# Patient Record
Sex: Male | Born: 1935 | Race: White | Hispanic: No | Marital: Married | Smoking: Former smoker
Health system: Southern US, Community
[De-identification: ages and names within clinical notes are randomized; demographics above are authoritative.]

## PROBLEM LIST (undated history)

## (undated) DIAGNOSIS — E119 Type 2 diabetes mellitus without complications: Secondary | ICD-10-CM

## (undated) DIAGNOSIS — I10 Essential (primary) hypertension: Secondary | ICD-10-CM

## (undated) DIAGNOSIS — E785 Hyperlipidemia, unspecified: Secondary | ICD-10-CM

## (undated) DIAGNOSIS — J189 Pneumonia, unspecified organism: Secondary | ICD-10-CM

## (undated) DIAGNOSIS — J45909 Unspecified asthma, uncomplicated: Secondary | ICD-10-CM

## (undated) DIAGNOSIS — J449 Chronic obstructive pulmonary disease, unspecified: Secondary | ICD-10-CM

## (undated) DIAGNOSIS — J42 Unspecified chronic bronchitis: Secondary | ICD-10-CM

## (undated) HISTORY — DX: Chronic obstructive pulmonary disease, unspecified: J44.9

## (undated) HISTORY — DX: Unspecified chronic bronchitis: J42

## (undated) HISTORY — DX: Type 2 diabetes mellitus without complications: E11.9

## (undated) HISTORY — DX: Essential (primary) hypertension: I10

## (undated) HISTORY — DX: Hyperlipidemia, unspecified: E78.5

## (undated) HISTORY — DX: Pneumonia, unspecified organism: J18.9

## (undated) HISTORY — DX: Unspecified asthma, uncomplicated: J45.909

---

## 1990-09-10 HISTORY — PX: OTHER SURGICAL HISTORY: SHX169

## 2012-04-07 ENCOUNTER — Other Ambulatory Visit (HOSPITAL_BASED_OUTPATIENT_CLINIC_OR_DEPARTMENT_OTHER): Payer: Self-pay | Admitting: Family Medicine

## 2012-04-07 ENCOUNTER — Ambulatory Visit (HOSPITAL_BASED_OUTPATIENT_CLINIC_OR_DEPARTMENT_OTHER)
Admission: RE | Admit: 2012-04-07 | Discharge: 2012-04-07 | Disposition: A | Discharge status: Home / Self Care | Payer: Medicare Other | Source: Ambulatory Visit | Attending: Family Medicine | Admitting: Family Medicine

## 2012-04-07 DIAGNOSIS — J449 Chronic obstructive pulmonary disease, unspecified: Secondary | ICD-10-CM | POA: Insufficient documentation

## 2012-04-07 DIAGNOSIS — J4489 Other specified chronic obstructive pulmonary disease: Secondary | ICD-10-CM | POA: Insufficient documentation

## 2012-04-07 DIAGNOSIS — Z87891 Personal history of nicotine dependence: Secondary | ICD-10-CM

## 2012-04-07 DIAGNOSIS — R0602 Shortness of breath: Secondary | ICD-10-CM | POA: Insufficient documentation

## 2013-01-22 ENCOUNTER — Telehealth: Payer: Self-pay | Admitting: *Deleted

## 2013-01-22 NOTE — Telephone Encounter (Signed)
He is due to come back for a lab appointment and a week later he needs to see Dr Alberteen Sam to get all his meds refilled (sometime in the beginning of June).  Please contact him and set him up.  Also find out what pharmacy he wants is pravastatin, let me know and I'll refill it for 30 days only.  Thanks PG

## 2013-01-22 NOTE — Telephone Encounter (Signed)
Needs 3 month refill on Provastin

## 2013-02-16 ENCOUNTER — Other Ambulatory Visit: Payer: Self-pay | Admitting: *Deleted

## 2013-02-16 ENCOUNTER — Other Ambulatory Visit: Payer: Medicare Other | Admitting: Family Medicine

## 2013-02-16 DIAGNOSIS — R7301 Impaired fasting glucose: Secondary | ICD-10-CM

## 2013-02-16 LAB — COMPLETE METABOLIC PANEL WITH GFR
ALT: 26 U/L (ref 0–53)
AST: 27 U/L (ref 0–37)
Albumin: 3.8 g/dL (ref 3.5–5.2)
Alkaline Phosphatase: 59 U/L (ref 39–117)
BUN: 21 mg/dL (ref 6–23)
CO2: 26 mEq/L (ref 19–32)
Calcium: 9.2 mg/dL (ref 8.4–10.5)
Chloride: 105 mEq/L (ref 96–112)
Creat: 1.21 mg/dL (ref 0.50–1.35)
GFR, Est African American: 65 mL/min
GFR, Est Non African American: 57 mL/min — ABNORMAL LOW
Glucose, Bld: 155 mg/dL — ABNORMAL HIGH (ref 70–99)
Potassium: 4.8 mEq/L (ref 3.5–5.3)
Sodium: 143 mEq/L (ref 135–145)
Total Bilirubin: 0.4 mg/dL (ref 0.3–1.2)
Total Protein: 6.7 g/dL (ref 6.0–8.3)

## 2013-02-16 LAB — HEMOGLOBIN A1C
Hgb A1c MFr Bld: 7 % — ABNORMAL HIGH (ref <5.7)
Mean Plasma Glucose: 154 mg/dL — ABNORMAL HIGH (ref <117)

## 2013-02-24 ENCOUNTER — Encounter: Payer: Self-pay | Admitting: Family Medicine

## 2013-02-24 ENCOUNTER — Ambulatory Visit (INDEPENDENT_AMBULATORY_CARE_PROVIDER_SITE_OTHER): Payer: Medicare Other | Admitting: Family Medicine

## 2013-02-24 VITALS — BP 139/72 | HR 83 | Wt 256.0 lb

## 2013-02-24 DIAGNOSIS — IMO0001 Reserved for inherently not codable concepts without codable children: Secondary | ICD-10-CM

## 2013-02-24 DIAGNOSIS — I10 Essential (primary) hypertension: Secondary | ICD-10-CM

## 2013-02-24 DIAGNOSIS — J4489 Other specified chronic obstructive pulmonary disease: Secondary | ICD-10-CM

## 2013-02-24 DIAGNOSIS — J449 Chronic obstructive pulmonary disease, unspecified: Secondary | ICD-10-CM

## 2013-02-24 DIAGNOSIS — E785 Hyperlipidemia, unspecified: Secondary | ICD-10-CM

## 2013-02-24 MED ORDER — LOSARTAN POTASSIUM-HCTZ 100-12.5 MG PO TABS: ORAL_TABLET | ORAL | Status: DC

## 2013-02-24 MED ORDER — PRAVASTATIN SODIUM 40 MG PO TABS: 40.0000 mg | ORAL_TABLET | Freq: Every day | ORAL | Status: DC

## 2013-02-24 MED ORDER — SAXAGLIPTIN-METFORMIN ER 2.5-1000 MG PO TB24: 1.0000 | ORAL_TABLET | Freq: Two times a day (BID) | ORAL | Status: DC

## 2013-02-24 NOTE — Patient Instructions (Addendum)
1)  Blood Sugar - Finish up the Kombigylze and Metformin you have then switch over to the Kombiglyze 2.01/999 twice a day.  Your A1c was up a little from 6.5 to 7 so you need to work a little harder on your diet and exercise.    2)  Cholesterol - Continue on the pravastatin.    3)  Blood Pressure - Stay on the losartan HCT.    4)  COPD - Continue on Symbicort.  Use 2 puffs twice a day on days that you are going to be very active.    Diabetes and Exercise Regular exercise is important and can help:   Control blood glucose (sugar).  Decrease blood pressure.    Control blood lipids (cholesterol, triglycerides).  Improve overall health. BENEFITS FROM EXERCISE  Improved fitness.  Improved flexibility.  Improved endurance.  Increased bone density.  Weight control.  Increased muscle strength.  Decreased body fat.  Improvement of the body's use of insulin, a hormone.  Increased insulin sensitivity.  Reduction of insulin needs.  Reduced stress and tension.  Helps you feel better. People with diabetes who add exercise to their lifestyle gain additional benefits, including:  Weight loss.  Reduced appetite.  Improvement of the body's use of blood glucose.  Decreased risk factors for heart disease:  Lowering of cholesterol and triglycerides.  Raising the level of good cholesterol (high-density lipoproteins, HDL).  Lowering blood sugar.  Decreased blood pressure. TYPE 1 DIABETES AND EXERCISE  Exercise will usually lower your blood glucose.  If blood glucose is greater than 240 mg/dl, check urine ketones. If ketones are present, do not exercise.  Location of the insulin injection sites may need to be adjusted with exercise. Avoid injecting insulin into areas of the body that will be exercised. For example, avoid injecting insulin into:  The arms when playing tennis.  The legs when jogging. For more information, discuss this with your caregiver.  Keep a  record of:  Food intake.  Type and amount of exercise.  Expected peak times of insulin action.  Blood glucose levels. Do this before, during, and after exercise. Review your records with your caregiver. This will help you to develop guidelines for adjusting food intake and insulin amounts.  TYPE 2 DIABETES AND EXERCISE  Regular physical activity can help control blood glucose.  Exercise is important because it may:  Increase the body's sensitivity to insulin.  Improve blood glucose control.  Exercise reduces the risk of heart disease. It decreases serum cholesterol and triglycerides. It also lowers blood pressure.  Those who take insulin or oral hypoglycemic agents should watch for signs of hypoglycemia. These signs include dizziness, shaking, sweating, chills, and confusion.  Body water is lost during exercise. It must be replaced. This will help to avoid loss of body fluids (dehydration) or heat stroke. Be sure to talk to your caregiver before starting an exercise program to make sure it is safe for you. Remember, any activity is better than none.  Document Released: 11/17/2003 Document Revised: 11/19/2011 Document Reviewed: 03/03/2009 [facility] Patient Information 2014 [place], [place].

## 2013-02-24 NOTE — Progress Notes (Signed)
  Subjective:    Patient ID: Joseph Payne, male    DOB: [birth year], 79 y.o.   MRN: [id number]  HPI  [name] is here today to go over his labs, discuss the issues below and get medication refills:  1)  Type II DM - He is taking Kombiglyze 5/500 and 2 metformin ER 500 mg daily.  He does not currently check his blood sugar.  He admits to not following a diabetic diet all of the time (ice cream/pie).  He has cut back on his wine considerably.    2)  BP - His BP is controlled on 1/2 of the losartan HCT twice a day.  3)  Cholesterol - He is doing fine on 40 mg of pravachol at night.   4)  COPD - He feels that his breathing is okay with 1 puff of Symbicort twice a day.  He is no longer doing Spiriva or Combivent.     Review of Systems  Constitutional: Negative for activity change, appetite change, fatigue and unexpected weight change.  HENT: Negative.   Eyes: Negative.   Respiratory: Negative for cough, chest tightness and shortness of breath.   Cardiovascular: Negative for chest pain, palpitations and leg swelling.  Gastrointestinal: Negative for abdominal pain, diarrhea, constipation and blood in stool.  Endocrine: Negative for polydipsia, polyphagia and polyuria.  Genitourinary: Negative for difficulty urinating.  Musculoskeletal: Negative for myalgias, joint swelling and arthralgias.  Skin: Negative for rash.  Neurological: Negative for dizziness, numbness and headaches.  Hematological: Negative.   Psychiatric/Behavioral: Negative.        Objective:   Physical Exam  Constitutional: He appears well-nourished.  HENT:  Head: Normocephalic.  Nose: Nose normal.  Mouth/Throat: Oropharynx is clear and moist.  Eyes: Conjunctivae are normal. No scleral icterus.  Neck: Neck supple. No thyromegaly present.  Cardiovascular: Normal rate, regular rhythm and normal heart sounds.   Pulmonary/Chest: Effort normal and breath sounds normal.  Abdominal: Soft. He exhibits no mass. There is no  tenderness.  Musculoskeletal: Normal range of motion.  Lymphadenopathy:    He has no cervical adenopathy.  Neurological: He is alert.  Skin: Skin is warm and dry. No rash noted.  Psychiatric: He has a normal mood and affect. His behavior is normal. Judgment and thought content normal.          Assessment & Plan:

## 2013-03-22 ENCOUNTER — Encounter: Payer: Self-pay | Admitting: Family Medicine

## 2013-03-22 DIAGNOSIS — J449 Chronic obstructive pulmonary disease, unspecified: Secondary | ICD-10-CM | POA: Insufficient documentation

## 2013-03-22 DIAGNOSIS — IMO0001 Reserved for inherently not codable concepts without codable children: Secondary | ICD-10-CM | POA: Insufficient documentation

## 2013-03-22 DIAGNOSIS — I10 Essential (primary) hypertension: Secondary | ICD-10-CM | POA: Insufficient documentation

## 2013-03-22 DIAGNOSIS — J4489 Other specified chronic obstructive pulmonary disease: Secondary | ICD-10-CM | POA: Insufficient documentation

## 2013-03-22 DIAGNOSIS — E785 Hyperlipidemia, unspecified: Secondary | ICD-10-CM | POA: Insufficient documentation

## 2013-03-22 NOTE — Assessment & Plan Note (Addendum)
He was given a prescription for Kombiglyze 10/998 to take BID.  He is to work harder on his diet and exercise.

## 2013-03-22 NOTE — Assessment & Plan Note (Signed)
He was reminded that he should be using the Symbicort BID.

## 2013-03-22 NOTE — Assessment & Plan Note (Signed)
Refilled his pravastatin  

## 2013-03-22 NOTE — Assessment & Plan Note (Signed)
Refilled his losartan HCT.   

## 2013-07-21 ENCOUNTER — Other Ambulatory Visit: Payer: Self-pay | Admitting: *Deleted

## 2013-07-21 DIAGNOSIS — E119 Type 2 diabetes mellitus without complications: Secondary | ICD-10-CM

## 2013-07-21 DIAGNOSIS — E785 Hyperlipidemia, unspecified: Secondary | ICD-10-CM

## 2013-07-21 DIAGNOSIS — R5381 Other malaise: Secondary | ICD-10-CM

## 2013-07-22 ENCOUNTER — Other Ambulatory Visit: Payer: Medicare Other

## 2013-07-22 LAB — CBC WITH DIFFERENTIAL/PLATELET
Basophils Absolute: 0.1 10*3/uL (ref 0.0–0.1)
Basophils Relative: 1 % (ref 0–1)
Eosinophils Absolute: 0.3 10*3/uL (ref 0.0–0.7)
Eosinophils Relative: 4 % (ref 0–5)
HCT: 40.1 % (ref 39.0–52.0)
Hemoglobin: 13.3 g/dL (ref 13.0–17.0)
Lymphocytes Relative: 23 % (ref 12–46)
Lymphs Abs: 2.1 10*3/uL (ref 0.7–4.0)
MCH: 31.7 pg (ref 26.0–34.0)
MCHC: 33.2 g/dL (ref 30.0–36.0)
MCV: 95.7 fL (ref 78.0–100.0)
Monocytes Absolute: 0.8 10*3/uL (ref 0.1–1.0)
Monocytes Relative: 8 % (ref 3–12)
Neutro Abs: 5.8 10*3/uL (ref 1.7–7.7)
Neutrophils Relative %: 64 % (ref 43–77)
Platelets: 207 10*3/uL (ref 150–400)
RBC: 4.19 MIL/uL — ABNORMAL LOW (ref 4.22–5.81)
RDW: 14.6 % (ref 11.5–15.5)
WBC: 9 10*3/uL (ref 4.0–10.5)

## 2013-07-22 LAB — COMPLETE METABOLIC PANEL WITH GFR
ALT: 22 U/L (ref 0–53)
AST: 23 U/L (ref 0–37)
Albumin: 3.7 g/dL (ref 3.5–5.2)
Alkaline Phosphatase: 71 U/L (ref 39–117)
BUN: 15 mg/dL (ref 6–23)
CO2: 26 mEq/L (ref 19–32)
Calcium: 8.8 mg/dL (ref 8.4–10.5)
Chloride: 103 mEq/L (ref 96–112)
Creat: 1.03 mg/dL (ref 0.50–1.35)
GFR, Est African American: 79 mL/min
GFR, Est Non African American: 68 mL/min
Glucose, Bld: 186 mg/dL — ABNORMAL HIGH (ref 70–99)
Potassium: 4.3 mEq/L (ref 3.5–5.3)
Sodium: 141 mEq/L (ref 135–145)
Total Bilirubin: 0.5 mg/dL (ref 0.3–1.2)
Total Protein: 6.7 g/dL (ref 6.0–8.3)

## 2013-07-22 LAB — LIPID PANEL
Cholesterol: 149 mg/dL (ref 0–200)
HDL: 37 mg/dL — ABNORMAL LOW (ref >39)
LDL Cholesterol: 80 mg/dL (ref 0–99)
Total CHOL/HDL Ratio: 4 Ratio
Triglycerides: 158 mg/dL — ABNORMAL HIGH (ref <150)
VLDL: 32 mg/dL (ref 0–40)

## 2013-07-22 LAB — HEMOGLOBIN A1C
Hgb A1c MFr Bld: 7.8 % — ABNORMAL HIGH (ref <5.7)
Mean Plasma Glucose: 177 mg/dL — ABNORMAL HIGH (ref <117)

## 2013-07-30 ENCOUNTER — Encounter: Payer: Self-pay | Admitting: Family Medicine

## 2013-07-30 ENCOUNTER — Ambulatory Visit (INDEPENDENT_AMBULATORY_CARE_PROVIDER_SITE_OTHER): Payer: Medicare Other | Admitting: Family Medicine

## 2013-07-30 VITALS — BP 147/63 | HR 87 | Resp 16 | Ht 74.3 in | Wt 249.0 lb

## 2013-07-30 DIAGNOSIS — IMO0001 Reserved for inherently not codable concepts without codable children: Secondary | ICD-10-CM

## 2013-07-30 DIAGNOSIS — J4489 Other specified chronic obstructive pulmonary disease: Secondary | ICD-10-CM

## 2013-07-30 DIAGNOSIS — I10 Essential (primary) hypertension: Secondary | ICD-10-CM

## 2013-07-30 DIAGNOSIS — J449 Chronic obstructive pulmonary disease, unspecified: Secondary | ICD-10-CM

## 2013-07-30 DIAGNOSIS — E785 Hyperlipidemia, unspecified: Secondary | ICD-10-CM

## 2013-07-30 MED ORDER — METFORMIN HCL 1000 MG PO TABS: 1000.0000 mg | ORAL_TABLET | Freq: Two times a day (BID) | ORAL | Status: DC

## 2013-07-30 MED ORDER — GLIPIZIDE ER 5 MG PO TB24: 5.0000 mg | ORAL_TABLET | Freq: Every day | ORAL | Status: DC

## 2013-07-30 MED ORDER — BUDESONIDE-FORMOTEROL FUMARATE 160-4.5 MCG/ACT IN AERO: 2.0000 | INHALATION_SPRAY | Freq: Two times a day (BID) | RESPIRATORY_TRACT | Status: DC

## 2013-07-30 MED ORDER — PIOGLITAZONE HCL 15 MG PO TABS: 15.0000 mg | ORAL_TABLET | Freq: Every day | ORAL | Status: DC

## 2013-07-30 MED ORDER — LOSARTAN POTASSIUM-HCTZ 100-12.5 MG PO TABS: ORAL_TABLET | ORAL | Status: DC

## 2013-07-30 MED ORDER — PRAVASTATIN SODIUM 40 MG PO TABS: 40.0000 mg | ORAL_TABLET | Freq: Every day | ORAL | Status: DC

## 2013-07-30 NOTE — Progress Notes (Signed)
  Subjective:    Patient ID: Joseph Payne, male    DOB: 05/03/1931, 79 y.o.   MRN: [id number]  HPI  Joseph Payne is here today to go over his most recent lab results and to discuss the conditions listed below:    1)  Hypertension:  He is currently taking losartan/HCT 100-12.5 mg (1/2 pill).  He also checks his blood pressure at home and it has been 140/80.    2)  Type II DM:  He is taking Kombiglyze XR (2.01-999 mg) every day.  He said his diet is "fair".  He has reduced his wine and ice cream every day.    3)  COPD:  He is doing well with Symbicort (160-4.5 mcg).  His only concern about this medication is the cost.  He has brought his new medication formulary.     Review of Systems  Constitutional: Negative.   HENT: Negative.   Eyes: Negative.   Respiratory: Positive for cough.   Cardiovascular: Negative.   Gastrointestinal: Negative.   Endocrine: Negative.   Genitourinary: Negative.   Musculoskeletal: Negative.   Skin: Negative.   Allergic/Immunologic: Negative.   Neurological: Negative.   Hematological: Negative.   Psychiatric/Behavioral: Negative.      Past Medical History  Diagnosis Date  . Hypertension   . Pneumonia   . Hyperlipidemia   . Diabetes mellitus without complication   . Other chronic bronchitis   . COPD (chronic obstructive pulmonary disease)     He smoked for about 10 years but worked in the [employer] for 45 years.      Family History  Problem Relation Age of Onset  . Asthma Mother   . Rheum arthritis Mother      History   Social History Narrative   Marital Status: Married Museum/gallery conservator)   Children: Daughter Lawson Fiscal)  and Son Tufo)    Pets:  None     Living Situation: Lives with  wife    Occupation: Retired Programmer, multimedia)    Education: Engineer, agricultural    Tobacco Use:  He smoked 1 ppd for about 10 years.  He quit 50 years ago at the age of 11.      Alcohol Use:  Occasional (Wine)    Drug Use:  None   Diet:  Regular   Exercise:  None    Hobbies: Gardening                    Objective:   Physical Exam  Vitals reviewed. Constitutional: He is oriented to person, place, and time. He appears well-developed and well-nourished. No distress.  HENT:  Head: Normocephalic.  Eyes: No scleral icterus.  Neck: Neck supple. No thyromegaly present.  Cardiovascular: Normal rate, regular rhythm and normal heart sounds.   Pulmonary/Chest: Effort normal and breath sounds normal.  Musculoskeletal: Normal range of motion. He exhibits no edema.  Neurological: He is alert and oriented to person, place, and time.  Skin: Skin is warm and dry. No rash noted.  Psychiatric: He has a normal mood and affect. His behavior is normal. Judgment and thought content normal.          Assessment & Plan:

## 2013-07-30 NOTE — Patient Instructions (Signed)
1)  Blood Sugar - Your A1c (average for 3 months) has increased so...limit your intake of sweets which includes juice.  Since the Kombiglyze is so expensive, let's try a new combination. Finish up the [employer] then switch over to a combination of pioglitazone 15 mg ([facility]) + glipizide XL 5 mg (HT)  + metformin 1000 mg (HT)  (2 x per day). We'll recheck your A1c at your CPE in 3 months.    2)  Cholesterol - Stay on the pravastatin.  I sent it to [facility] (Mail Order)     Diabetes and Standards of Medical Care  Diabetes is complicated. You may find that your diabetes team includes a dietitian, nurse, diabetes educator, eye doctor, and more. To help everyone know what is going on and to help you get the care you deserve, the following schedule of care was developed to help keep you on track. Below are the tests, exams, vaccines, medicines, education, and plans you will need. HbA1c test This test shows how well you have controlled your glucose over the past 2 3 months. It is used to see if your diabetes management plan needs to be adjusted.   It is performed at least 2 times a year if you are meeting treatment goals.  It is performed 4 times a year if therapy has changed or if you are not meeting treatment goals. Blood pressure test  This test is performed at every routine medical visit. The goal is less than 140/90 mmHg for most people, but 130/80 mmHg in some cases. Ask your health care provider about your goal. Dental exam  Follow up with the dentist regularly. Eye exam  If you are diagnosed with type 1 diabetes as a child, get an exam upon reaching the age of 10 years or older and have had diabetes for 3 5 years. Yearly eye exams are recommended after that initial eye exam.  If you are diagnosed with type 1 diabetes as an adult, get an exam within 5 years of diagnosis and then yearly.  If you are diagnosed with type 2 diabetes, get an exam as soon as possible after the diagnosis and then  yearly. Foot care exam  Visual foot exams are performed at every routine medical visit. The exams check for cuts, injuries, or other problems with the feet.  A comprehensive foot exam should be done yearly. This includes visual inspection as well as assessing foot pulses and testing for loss of sensation.  Check your feet nightly for cuts, injuries, or other problems with your feet. Tell your health care provider if anything is not healing. Kidney function test (urine microalbumin)  This test is performed once a year.  Type 1 diabetes: The first test is performed 5 years after diagnosis.  Type 2 diabetes: The first test is performed at the time of diagnosis.  A serum creatinine and estimated glomerular filtration rate (eGFR) test is done once a year to assess the level of chronic kidney disease (CKD), if present. Lipid profile (cholesterol, HDL, LDL, triglycerides)  Performed every 5 years for most people.  The goal for LDL is less than 100 mg/dL. If you are at high risk, the goal is less than 70 mg/dL.  The goal for HDL is 40 mg/dL 50 mg/dL for men and 50 mg/dL 60 mg/dL for women. An HDL cholesterol of 60 mg/dL or higher gives some protection against heart disease.  The goal for triglycerides is less than 150 mg/dL. Influenza vaccine, pneumococcal vaccine,  and hepatitis B vaccine  The influenza vaccine is recommended yearly.  The pneumococcal vaccine is generally given once in a lifetime. However, there are some instances when another vaccination is recommended. Check with your health care provider.  The hepatitis B vaccine is also recommended for adults with diabetes. Diabetes self-management education  Education is recommended at diagnosis and ongoing as needed. Treatment plan  Your treatment plan is reviewed at every medical visit. Document Released: 06/24/2009 Document Revised: 04/29/2013 Document Reviewed: 01/27/2013 [facility] Patient Information 2014 [place].

## 2013-07-30 NOTE — Assessment & Plan Note (Addendum)
His BP is higher than it should be. He is only taking his losartan-HCT 1/2 pill daily. He is supposed to be taking it twice a day.  If he can not remember to take his second dosage, he is to take it 1 x per day.  He will continue to get it at [facility].

## 2013-07-30 NOTE — Assessment & Plan Note (Signed)
His LDL is perfect on pravastatin.  His prescription was sent to [facility].

## 2013-07-30 NOTE — Assessment & Plan Note (Addendum)
His A1c has increased. Since the Kombiglyze is so expensive, we are going to try him on Actos 15 mg ([facility] RX), metformin 1000 mg BID and glipizide XL 5 mg daily.  He will get the latter 2 at [facility]  He is up to date on his eye exam.  We'll recheck his A1c in 3 months at his CPE.

## 2013-07-30 NOTE — Assessment & Plan Note (Signed)
Refilled his Symbicort.

## 2013-11-03 ENCOUNTER — Encounter: Payer: Medicare Other | Admitting: Family Medicine

## 2013-11-26 ENCOUNTER — Ambulatory Visit (INDEPENDENT_AMBULATORY_CARE_PROVIDER_SITE_OTHER): Payer: Medicare Other | Admitting: Family Medicine

## 2013-11-26 ENCOUNTER — Ambulatory Visit (HOSPITAL_BASED_OUTPATIENT_CLINIC_OR_DEPARTMENT_OTHER)
Admission: RE | Admit: 2013-11-26 | Discharge: 2013-11-26 | Disposition: A | Discharge status: Home / Self Care | Payer: Medicare Other | Source: Ambulatory Visit | Attending: Family Medicine | Admitting: Family Medicine

## 2013-11-26 ENCOUNTER — Encounter: Payer: Self-pay | Admitting: Family Medicine

## 2013-11-26 VITALS — BP 119/66 | HR 74 | Resp 16 | Wt 250.0 lb

## 2013-11-26 DIAGNOSIS — E785 Hyperlipidemia, unspecified: Secondary | ICD-10-CM

## 2013-11-26 DIAGNOSIS — E119 Type 2 diabetes mellitus without complications: Secondary | ICD-10-CM

## 2013-11-26 DIAGNOSIS — J449 Chronic obstructive pulmonary disease, unspecified: Secondary | ICD-10-CM

## 2013-11-26 DIAGNOSIS — R059 Cough, unspecified: Secondary | ICD-10-CM

## 2013-11-26 DIAGNOSIS — J4489 Other specified chronic obstructive pulmonary disease: Secondary | ICD-10-CM | POA: Insufficient documentation

## 2013-11-26 DIAGNOSIS — R05 Cough: Secondary | ICD-10-CM | POA: Insufficient documentation

## 2013-11-26 LAB — POCT GLYCOSYLATED HEMOGLOBIN (HGB A1C): Hemoglobin A1C: 6.4

## 2013-11-26 MED ORDER — BUDESONIDE-FORMOTEROL FUMARATE 160-4.5 MCG/ACT IN AERO: 2.0000 | INHALATION_SPRAY | Freq: Two times a day (BID) | RESPIRATORY_TRACT | Status: AC

## 2013-11-26 MED ORDER — PRAVASTATIN SODIUM 40 MG PO TABS: 40.0000 mg | ORAL_TABLET | Freq: Every day | ORAL | Status: DC

## 2013-11-26 MED ORDER — GLIPIZIDE ER 5 MG PO TB24
5.0000 mg | ORAL_TABLET | Freq: Every day | ORAL | Status: AC
Start: 2013-11-26 — End: 2014-11-26

## 2013-11-26 MED ORDER — PIOGLITAZONE HCL 15 MG PO TABS: 15.0000 mg | ORAL_TABLET | Freq: Every day | ORAL | Status: DC

## 2013-11-26 MED ORDER — METFORMIN HCL ER 500 MG PO TB24: 1000.0000 mg | ORAL_TABLET | Freq: Every day | ORAL | Status: DC

## 2013-11-26 NOTE — Progress Notes (Signed)
Subjective:    Patient ID: Joseph Payne, male    DOB: [birth year], 80 y.o.   MRN: [id number]  HPI  Joseph Payne is here today discuss the conditions listed below:   1)  Type II DM.  He has done well since his last office visit.  He was unable to tolerate more than 1000 mg of metformin. He also takes Actos (15 mg daily) and glipizide (5 mg daily). He needs his A1c rechecked.    2)  Cough - He would like to have a chest x-ray.  He has been having a mild productive cough for several months and wants to make sure he is not developing pneumonia.  He takes [employer].   3)  Hyperlipidemia - He is doing fine on pravastatin.    4)  COPD - His breathing has been better over the past couple of years on Symbicort.       Review of Systems  Constitutional: Negative for fatigue and unexpected weight change.  HENT: Negative.   Respiratory: Positive for cough (productive). Negative for shortness of breath.   Cardiovascular: Negative for chest pain, palpitations and leg swelling.  Gastrointestinal: Negative.   Genitourinary: Negative.   Musculoskeletal: Negative for myalgias.  Skin: Negative.   Neurological: Negative.   Psychiatric/Behavioral: Negative.      Past Medical History  Diagnosis Date  . Hypertension   . Pneumonia   . Hyperlipidemia   . Diabetes mellitus without complication   . Other chronic bronchitis   . COPD (chronic obstructive pulmonary disease)     He smoked for about 10 years but worked in the [employer] for 45 years.      Past Surgical History  Procedure Laterality Date  . Inguinal hernia repair  1992    Left     History   Social History Narrative   Marital Status: Married Museum/gallery conservator)   Children: Daughter Joseph Payne)  and Son Joseph Payne)    Pets:  None     Living Situation: Lives with  wife    Occupation: Retired Programmer, multimedia)    Education: Engineer, agricultural    Tobacco Use:  He smoked 1 ppd for about 10 years.  He quit 50 years ago at the age of 87.      Alcohol Use:  Occasional (Wine)    Drug Use:  None   Diet:  Regular   Exercise:  None   Hobbies: Gardening                  Family History  Problem Relation Age of Onset  . Asthma Mother   . Rheum arthritis Mother      Current Outpatient Prescriptions on File Prior to Visit  Medication Sig Dispense Refill  . losartan-hydrochlorothiazide (HYZAAR) 100-12.5 MG per tablet Take 1/2 tablet po twice a day  180 tablet  3  . metFORMIN (GLUCOPHAGE) 1000 MG tablet Take 1 tablet (1,000 mg total) by mouth 2 (two) times daily with a meal.  60 tablet  5   No current facility-administered medications on file prior to visit.     No Known Allergies      Objective:   Physical Exam  Constitutional: He appears well-nourished. No distress.  HENT:  Mouth/Throat: No oropharyngeal exudate.  Eyes: Conjunctivae are normal.  Neck: Neck supple.  Cardiovascular: Normal rate, regular rhythm and normal heart sounds.   Pulmonary/Chest: Effort normal and breath sounds normal. No respiratory distress. He has no wheezes. He has no rales.  Lymphadenopathy:  He has no cervical adenopathy.      Assessment & Plan:    Joseph FearingJames was seen today for medication management.  Diagnoses and associated orders for this visit:  Cough Comments: His CXR was normal.   - DG Chest 2 View; Future  Other and unspecified hyperlipidemia - pravastatin (PRAVACHOL) 40 MG tablet; Take 1 tablet (40 mg total) by mouth at bedtime.  COPD (chronic obstructive pulmonary disease) with chronic bronchitis - budesonide-formoterol (SYMBICORT) 160-4.5 MCG/ACT inhaler; Inhale 2 puffs into the lungs 2 (two) times daily.  Type II or unspecified type diabetes mellitus without mention of complication, not stated as uncontrolled - POCT glycosylated hemoglobin (Hb A1C) 6.4% - pioglitazone (ACTOS) 15 MG tablet; Take 1 tablet (15 mg total) by mouth daily. - glipiZIDE (GLUCOTROL XL) 5 MG 24 hr tablet; Take 1 tablet (5 mg total) by mouth  daily with breakfast. - metFORMIN (GLUCOPHAGE XR) 500 MG 24 hr tablet; Take 2 tablets (1,000 mg total) by mouth daily.

## 2014-01-20 ENCOUNTER — Other Ambulatory Visit: Payer: Self-pay | Admitting: Family Medicine

## 2014-01-31 DIAGNOSIS — E119 Type 2 diabetes mellitus without complications: Secondary | ICD-10-CM | POA: Insufficient documentation

## 2014-01-31 DIAGNOSIS — R05 Cough: Secondary | ICD-10-CM | POA: Insufficient documentation

## 2014-01-31 DIAGNOSIS — R059 Cough, unspecified: Secondary | ICD-10-CM | POA: Insufficient documentation

## 2014-02-01 ENCOUNTER — Other Ambulatory Visit: Payer: Self-pay | Admitting: Family Medicine

## 2014-02-09 ENCOUNTER — Encounter: Payer: Self-pay | Admitting: Family Medicine

## 2014-02-09 ENCOUNTER — Ambulatory Visit (INDEPENDENT_AMBULATORY_CARE_PROVIDER_SITE_OTHER): Payer: Medicare Other | Admitting: Family Medicine

## 2014-02-09 VITALS — BP 146/65 | HR 83 | Resp 16 | Ht 73.5 in | Wt 255.0 lb

## 2014-02-09 DIAGNOSIS — E119 Type 2 diabetes mellitus without complications: Secondary | ICD-10-CM

## 2014-02-09 DIAGNOSIS — Z0000 Encounter for general adult medical examination without abnormal findings: Secondary | ICD-10-CM

## 2014-02-09 DIAGNOSIS — Z1211 Encounter for screening for malignant neoplasm of colon: Secondary | ICD-10-CM

## 2014-02-09 DIAGNOSIS — Z23 Encounter for immunization: Secondary | ICD-10-CM

## 2014-02-09 DIAGNOSIS — I10 Essential (primary) hypertension: Secondary | ICD-10-CM

## 2014-02-09 LAB — POCT URINALYSIS DIPSTICK
Bilirubin, UA: NEGATIVE
Blood, UA: NEGATIVE
Glucose, UA: NEGATIVE
Ketones, UA: NEGATIVE
Leukocytes, UA: NEGATIVE
Nitrite, UA: NEGATIVE
Protein, UA: NEGATIVE
Spec Grav, UA: 1.02
Urobilinogen, UA: NEGATIVE
pH, UA: 5

## 2014-02-09 LAB — POCT UA - MICROALBUMIN
Albumin/Creatinine Ratio, Urine, POC: <2.7
Creatinine, POC: 183.9 mg/dL
Microalbumin Ur, POC: <5 mg/L

## 2014-02-09 MED ORDER — ZOSTER VACCINE LIVE 19400 UNT/0.65ML ~~LOC~~ SOLR: 0.6500 mL | Freq: Once | SUBCUTANEOUS | Status: AC

## 2014-02-09 MED ORDER — TETANUS-DIPHTH-ACELL PERTUSSIS 5-2.5-18.5 LF-MCG/0.5 IM SUSP: 0.5000 mL | Freq: Once | INTRAMUSCULAR | Status: AC

## 2014-02-09 NOTE — Progress Notes (Signed)
Subjective:    Patient ID: Joseph Payne, male    DOB: 06/04/1931, 80 y.o.   MRN: [id number]  HPI  Joseph Payne is here today for his annual CPE. He is doing well and has no medical complaints today.    Review of Systems  Constitutional: Negative for activity change, appetite change and unexpected weight change.  Cardiovascular: Negative for chest pain, palpitations and leg swelling.  Endocrine: Negative for polydipsia, polyphagia and polyuria.  Psychiatric/Behavioral: Negative for behavioral problems and sleep disturbance. The patient is not nervous/anxious.   All other systems reviewed and are negative.    Past Medical History  Diagnosis Date  . Hypertension   . Pneumonia   . Hyperlipidemia   . Diabetes mellitus without complication   . Other chronic bronchitis   . COPD (chronic obstructive pulmonary disease)     He smoked for about 10 years but worked in the [employer] industry for 45 years.   . Asthma      Past Surgical History  Procedure Laterality Date  . 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  He quit 50 years ago at the age of 80.      Alcohol Use:  Occasional (Wine)    Drug Use:  None   Diet:  Regular   Exercise:  None   Hobbies: Gardening                  Family History  Problem Relation Age of Onset  . Adopted: Yes  . Asthma Mother   . Rheum arthritis Mother      Current Outpatient Prescriptions on File Prior to Visit  Medication Sig Dispense Refill  . budesonide-formoterol (SYMBICORT) 160-4.5 MCG/ACT inhaler Inhale 2 puffs into the lungs 2 (two) times daily.  1 Inhaler  11  . glipiZIDE (GLUCOTROL XL) 5 MG 24 hr tablet Take 1 tablet (5 mg total) by  mouth daily with breakfast.  30 tablet  5   No current facility-administered medications on file prior to visit.     Allergies  Allergen Reactions  . Codeine Other (See Comments)    Light headed     Immunization History  Administered Date(s) Administered  . Pneumococcal Conjugate-13 02/09/2014       Objective:   Physical Exam  Nursing note and vitals reviewed. Constitutional: He is oriented to person, place, and time. He appears well-developed and well-nourished.  HENT:  Head: Normocephalic and atraumatic.  Right Ear: External ear normal.  Left Ear: External ear normal.  Nose: Nose normal.  Mouth/Throat: Oropharynx is clear and moist.  Eyes: Conjunctivae and EOM are normal. Pupils are equal, round, and reactive to light.  Neck: Normal range of motion. Neck supple.  Cardiovascular: Normal rate, regular rhythm, normal heart sounds and intact distal pulses.   Pulmonary/Chest: Effort normal and breath sounds normal.  Abdominal: Soft. Bowel sounds are normal.  Musculoskeletal: Normal range of motion.  Neurological: He is alert and oriented to person, place, and time. He has normal reflexes.  Skin: Skin is warm and dry.  Psychiatric: He has a normal mood and  affect. His behavior is normal. Judgment and thought content normal.      Assessment & Plan:    Joseph Ashing was seen today for annual exam.  Diagnoses and associated orders for this visit:  Routine general medical examination at a health care facility - POCT urinalysis dipstick  Need for prophylactic vaccination against Streptococcus pneumoniae (pneumococcus) - Pneumococcal conjugate vaccine 13-valent  Need for Tdap vaccination - Tdap (BOOSTRIX) 5-2.5-18.5 LF-MCG/0.5 injection; Inject 0.5 mLs into the muscle once.  Need for shingles vaccine - zoster vaccine live, PF, (ZOSTAVAX) 00867 UNT/0.65ML injection; Inject 19,400 Units into the skin once.  Colon cancer screening - Ambulatory referral to Gastroenterology  Type  II or unspecified type diabetes mellitus without mention of complication, not stated as uncontrolled - POCT UA - Microalbumin  Essential hypertension, benign - POCT UA - Microalbumin

## 2014-02-09 NOTE — Patient Instructions (Signed)
Preventive Care for Adults, Male A healthy lifestyle and preventive care can promote health and wellness. Preventive health guidelines for men include the following key practices:  A routine yearly physical is a good way to check with your health care provider about your health and preventative screening. It is a chance to share any concerns and updates on your health and to receive a thorough exam.  Visit your dentist for a routine exam and preventative care every 6 months. Brush your teeth twice a day and floss once a day. Good oral hygiene prevents tooth decay and gum disease.  The frequency of eye exams is based on your age, health, family medical history, use of contact lenses, and other factors. Follow your health care provider's recommendations for frequency of eye exams.  Eat a healthy diet. Foods such as vegetables, fruits, whole grains, low-fat dairy products, and lean protein foods contain the nutrients you need without too many calories. Decrease your intake of foods high in solid fats, added sugars, and salt. Eat the right amount of calories for you.Get information about a proper diet from your health care provider, if necessary.  Regular physical exercise is one of the most important things you can do for your health. Most adults should get at least 150 minutes of moderate-intensity exercise (any activity that increases your heart rate and causes you to sweat) each week. In addition, most adults need muscle-strengthening exercises on 2 or more days a week.  Maintain a healthy weight. The body mass index (BMI) is a screening tool to identify possible weight problems. It provides an estimate of body fat based on height and weight. Your health care provider can find your BMI and can help you achieve or maintain a healthy weight.For adults 20 years and older:  A BMI below 18.5 is considered underweight.  A BMI of 18.5 to 24.9 is normal.  A BMI of 25 to 29.9 is considered  overweight.  A BMI of 30 and above is considered obese.  Maintain normal blood lipids and cholesterol levels by exercising and minimizing your intake of saturated fat. Eat a balanced diet with plenty of fruit and vegetables. Blood tests for lipids and cholesterol should begin at age 42 and be repeated every 5 years. If your lipid or cholesterol levels are high, you are over 50, or you are at high risk for heart disease, you may need your cholesterol levels checked more frequently.Ongoing high lipid and cholesterol levels should be treated with medicines if diet and exercise are not working.  If you smoke, find out from your health care provider how to quit. If you do not use tobacco, do not start.  Lung cancer screening is recommended for adults aged 24 80 years who are at high risk for developing lung cancer because of a history of smoking. A yearly low-dose CT scan of the lungs is recommended for people who have at least a 30-pack-year history of smoking and are a current smoker or have quit within the past 15 years. A pack year of smoking is smoking an average of 1 pack of cigarettes a day for 1 year (for example: 1 pack a day for 30 years or 2 packs a day for 15 years). Yearly screening should continue until the smoker has stopped smoking for at least 15 years. Yearly screening should be stopped for people who develop a health problem that would prevent them from having lung cancer treatment.  If you choose to drink alcohol, do not have  more than 2 drinks per day. One drink is considered to be 12 ounces (355 mL) of beer, 5 ounces (148 mL) of wine, or 1.5 ounces (44 mL) of liquor.  Avoid use of street drugs. Do not share needles with anyone. Ask for help if you need support or instructions about stopping the use of drugs.  High blood pressure causes heart disease and increases the risk of stroke. Your blood pressure should be checked at least every 1 2 years. Ongoing high blood pressure should be  treated with medicines, if weight loss and exercise are not effective.  If you are 75 78 years old, ask your health care provider if you should take aspirin to prevent heart disease.  Diabetes screening involves taking a blood sample to check your fasting blood sugar level. This should be done once every 3 years, after age 19, if you are within normal weight and without risk factors for diabetes. Testing should be considered at a younger age or be carried out more frequently if you are overweight and have at least 1 risk factor for diabetes.  Colorectal cancer can be detected and often prevented. Most routine colorectal cancer screening begins at the age of 47 and continues through age 80. However, your health care provider may recommend screening at an earlier age if you have risk factors for colon cancer. On a yearly basis, your health care provider may provide home test kits to check for hidden blood in the stool. Use of a small camera at the end of a tube to directly examine the colon (sigmoidoscopy or colonoscopy) can detect the earliest forms of colorectal cancer. Talk to your health care provider about this at age 66, when routine screening begins. Direct exam of the colon should be repeated every 5 10 years through age 19, unless early forms of precancerous polyps or small growths are found.  People who are at an increased risk for hepatitis B should be screened for this virus. You are considered at high risk for hepatitis B if:  You were born in a country where hepatitis B occurs often. Talk with your health care provider about which countries are considered high-risk.  Your parents were born in a high-risk country and you have not received a shot to protect against hepatitis B (hepatitis B vaccine).  You have HIV or AIDS.  You use needles to inject street drugs.  You live with, or have sex with, someone who has hepatitis B.  You are a man who has sex with other men (MSM).  You get  hemodialysis treatment.  You take certain medicines for conditions such as cancer, organ transplantation, and autoimmune conditions.  Hepatitis C blood testing is recommended for all people born from [birth year] through 1965 and any individual with known risks for hepatitis C.  Practice safe sex. Use condoms and avoid high-risk sexual practices to reduce the spread of sexually transmitted infections (STIs). STIs include gonorrhea, chlamydia, syphilis, trichomonas, herpes, HPV, and human immunodeficiency virus (HIV). Herpes, HIV, and HPV are viral illnesses that have no cure. They can result in disability, cancer, and death.  A one-time screening for abdominal aortic aneurysm (AAA) and surgical repair of large AAAs by ultrasound are recommended for men ages 94 to 74 years who are current or former smokers.  Healthy men should no longer receive prostate-specific antigen (PSA) blood tests as part of routine cancer screening. Talk with your health care provider about prostate cancer screening.  Testicular cancer screening is not recommended  for adult males who have no symptoms. Screening includes self-exam, a health care provider exam, and other screening tests. Consult with your health care provider about any symptoms you have or any concerns you have about testicular cancer.  Use sunscreen. Apply sunscreen liberally and repeatedly throughout the day. You should seek shade when your shadow is shorter than you. Protect yourself by wearing long sleeves, pants, a wide-brimmed hat, and sunglasses year round, whenever you are outdoors.  Once a month, do a whole-body skin exam, using a mirror to look at the skin on your back. Tell your health care provider about new moles, moles that have irregular borders, moles that are larger than a pencil eraser, or moles that have changed in shape or color.  Stay current with required vaccines (immunizations).  Influenza vaccine. All adults should be immunized every  year.  Tetanus, diphtheria, and acellular pertussis (Td, Tdap) vaccine. An adult who has not previously received Tdap or who does not know his vaccine status should receive 1 dose of Tdap. This initial dose should be followed by tetanus and diphtheria toxoids (Td) booster doses every 10 years. Adults with an unknown or incomplete history of completing a 3-dose immunization series with Td-containing vaccines should begin or complete a primary immunization series including a Tdap dose. Adults should receive a Td booster every 10 years.  Varicella vaccine. An adult without evidence of immunity to varicella should receive 2 doses or a second dose if he has previously received 1 dose.  Human papillomavirus (HPV) vaccine. Males aged 44 21 years who have not received the vaccine previously should receive the 3-dose series. Males aged 43 26 years may be immunized. Immunization is recommended through the age of 50 years for any male who has sex with males and did not get any or all doses earlier. Immunization is recommended for any person with an immunocompromised condition through the age of 23 years if he did not get any or all doses earlier. During the 3-dose series, the second dose should be obtained 4 8 weeks after the first dose. The third dose should be obtained 24 weeks after the first dose and 16 weeks after the second dose.  Zoster vaccine. One dose is recommended for adults aged 96 years or older unless certain conditions are present.  Measles, mumps, and rubella (MMR) vaccine. Adults born before 55 generally are considered immune to measles and mumps. Adults born in [birth year] or later should have 1 or more doses of MMR vaccine unless there is a contraindication to the vaccine or there is laboratory evidence of immunity to each of the three diseases. A routine second dose of MMR vaccine should be obtained at least 28 days after the first dose for students attending postsecondary schools, health care  workers, or international travelers. People who received inactivated measles vaccine or an unknown type of measles vaccine during 1963 1967 should receive 2 doses of MMR vaccine. People who received inactivated mumps vaccine or an unknown type of mumps vaccine before 1979 and are at high risk for mumps infection should consider immunization with 2 doses of MMR vaccine. Unvaccinated health care workers born before [birth year] who lack laboratory evidence of measles, mumps, or rubella immunity or laboratory confirmation of disease should consider measles and mumps immunization with 2 doses of MMR vaccine or rubella immunization with 1 dose of MMR vaccine.  Pneumococcal 13-valent conjugate (PCV13) vaccine. When indicated, a person who is uncertain of his immunization history and has no record of immunization  should receive the PCV13 vaccine. An adult aged 67 years or older who has certain medical conditions and has not been previously immunized should receive 1 dose of PCV13 vaccine. This PCV13 should be followed with a dose of pneumococcal polysaccharide (PPSV23) vaccine. The PPSV23 vaccine dose should be obtained at least 8 weeks after the dose of PCV13 vaccine. An adult aged 79 years or older who has certain medical conditions and previously received 1 or more doses of PPSV23 vaccine should receive 1 dose of PCV13. The PCV13 vaccine dose should be obtained 1 or more years after the last PPSV23 vaccine dose.  Pneumococcal polysaccharide (PPSV23) vaccine. When PCV13 is also indicated, PCV13 should be obtained first. All adults aged 74 years and older should be immunized. An adult younger than age 50 years who has certain medical conditions should be immunized. Any person who resides in a nursing home or long-term care facility should be immunized. An adult smoker should be immunized. People with an immunocompromised condition and certain other conditions should receive both PCV13 and PPSV23 vaccines. People with human  immunodeficiency virus (HIV) infection should be immunized as soon as possible after diagnosis. Immunization during chemotherapy or radiation therapy should be avoided. Routine use of PPSV23 vaccine is not recommended for American Indians, [place] Natives, or people younger than 65 years unless there are medical conditions that require PPSV23 vaccine. When indicated, people who have unknown immunization and have no record of immunization should receive PPSV23 vaccine. One-time revaccination 5 years after the first dose of PPSV23 is recommended for people aged 41 64 years who have chronic kidney failure, nephrotic syndrome, asplenia, or immunocompromised conditions. People who received 1 2 doses of PPSV23 before age 15 years should receive another dose of PPSV23 vaccine at age 48 years or later if at least 5 years have passed since the previous dose. Doses of PPSV23 are not needed for people immunized with PPSV23 at or after age 69 years.  Meningococcal vaccine. Adults with asplenia or persistent complement component deficiencies should receive 2 doses of quadrivalent meningococcal conjugate (MenACWY-D) vaccine. The doses should be obtained at least 2 months apart. Microbiologists working with certain meningococcal bacteria, [place] recruits, people at risk during an outbreak, and people who travel to or live in countries with a high rate of meningitis should be immunized. A first-year college student up through age 7 years who is living in a residence hall should receive a dose if he did not receive a dose on or after his 16th birthday. Adults who have certain high-risk conditions should receive one or more doses of vaccine.  Hepatitis A vaccine. Adults who wish to be protected from this disease, have certain high-risk conditions, work with hepatitis A-infected animals, work in hepatitis A research labs, or travel to or work in countries with a high rate of hepatitis A should be immunized. Adults who were  previously unvaccinated and who anticipate close contact with an international adoptee during the first 60 days after arrival in the [place] from a country with a high rate of hepatitis A should be immunized.  Hepatitis B vaccine. Adults who wish to be protected from this disease, have certain high-risk conditions, may be exposed to blood or other infectious body fluids, are household contacts or sex partners of hepatitis B positive people, are clients or workers in certain care facilities, or travel to or work in countries with a high rate of hepatitis B should be immunized.  Haemophilus influenzae type b (Hib) vaccine. A  previously unvaccinated person with asplenia or sickle cell disease or having a scheduled splenectomy should receive 1 dose of Hib vaccine. Regardless of previous immunization, a recipient of a hematopoietic stem cell transplant should receive a 3-dose series 6 12 months after his successful transplant. Hib vaccine is not recommended for adults with HIV infection. Preventive Service / Frequency Ages 62 to 3  Blood pressure check.** / Every 1 to 2 years.  Lipid and cholesterol check.** / Every 5 years beginning at age 43.  Hepatitis C blood test.** / For any individual with known risks for hepatitis C.  Skin self-exam. / Monthly.  Influenza vaccine. / Every year.  Tetanus, diphtheria, and acellular pertussis (Tdap, Td) vaccine.** / Consult your health care provider. 1 dose of Td every 10 years.  Varicella vaccine.** / Consult your health care provider.  HPV vaccine. / 3 doses over 6 months, if 48 or younger.  Measles, mumps, rubella (MMR) vaccine.** / You need at least 1 dose of MMR if you were born in 1957 or later. You may also need a second dose.  Pneumococcal 13-valent conjugate (PCV13) vaccine.** / Consult your health care provider.  Pneumococcal polysaccharide (PPSV23) vaccine.** / 1 to 2 doses if you smoke cigarettes or if you have certain  conditions.  Meningococcal vaccine.** / 1 dose if you are age 8 to 70 years and a Market researcher living in a residence hall, or have one of several medical conditions. You may also need additional booster doses.  Hepatitis A vaccine.** / Consult your health care provider.  Hepatitis B vaccine.** / Consult your health care provider.  Haemophilus influenzae type b (Hib) vaccine.** / Consult your health care provider. Ages 48 to 32  Blood pressure check.** / Every 1 to 2 years.  Lipid and cholesterol check.** / Every 5 years beginning at age 38.  Lung cancer screening. / Every year if you are aged 40 80 years and have a 30-pack-year history of smoking and currently smoke or have quit within the past 15 years. Yearly screening is stopped once you have quit smoking for at least 15 years or develop a health problem that would prevent you from having lung cancer treatment.  Fecal occult blood test (FOBT) of stool. / Every year beginning at age 4 and continuing until age 70. You may not have to do this test if you get a colonoscopy every 10 years.  Flexible sigmoidoscopy** or colonoscopy.** / Every 5 years for a flexible sigmoidoscopy or every 10 years for a colonoscopy beginning at age 76 and continuing until age 62.  Hepatitis C blood test.** / For all people born from [birth year] through 1965 and any individual with known risks for hepatitis C.  Skin self-exam. / Monthly.  Influenza vaccine. / Every year.  Tetanus, diphtheria, and acellular pertussis (Tdap/Td) vaccine.** / Consult your health care provider. 1 dose of Td every 10 years.  Varicella vaccine.** / Consult your health care provider.  Zoster vaccine.** / 1 dose for adults aged 60 years or older.  Measles, mumps, rubella (MMR) vaccine.** / You need at least 1 dose of MMR if you were born in 1957 or later. You may also need a second dose.  Pneumococcal 13-valent conjugate (PCV13) vaccine.** / Consult your health care  provider.  Pneumococcal polysaccharide (PPSV23) vaccine.** / 1 to 2 doses if you smoke cigarettes or if you have certain conditions.  Meningococcal vaccine.** / Consult your health care provider.  Hepatitis A vaccine.** / Consult your health care  provider.  Hepatitis B vaccine.** / Consult your health care provider.  Haemophilus influenzae type b (Hib) vaccine.** / Consult your health care provider. Ages 65 and over  Blood pressure check.** / Every 1 to 2 years.  Lipid and cholesterol check.**/ Every 5 years beginning at age 20.  Lung cancer screening. / Every year if you are aged 55 80 years and have a 30-pack-year history of smoking and currently smoke or have quit within the past 15 years. Yearly screening is stopped once you have quit smoking for at least 15 years or develop a health problem that would prevent you from having lung cancer treatment.  Fecal occult blood test (FOBT) of stool. / Every year beginning at age 50 and continuing until age 75. You may not have to do this test if you get a colonoscopy every 10 years.  Flexible sigmoidoscopy** or colonoscopy.** / Every 5 years for a flexible sigmoidoscopy or every 10 years for a colonoscopy beginning at age 50 and continuing until age 75.  Hepatitis C blood test.** / For all people born from 1945 through 1965 and any individual with known risks for hepatitis C.  Abdominal aortic aneurysm (AAA) screening.** / A one-time screening for ages 65 to 75 years who are current or former smokers.  Skin self-exam. / Monthly.  Influenza vaccine. / Every year.  Tetanus, diphtheria, and acellular pertussis (Tdap/Td) vaccine.** / 1 dose of Td every 10 years.  Varicella vaccine.** / Consult your health care provider.  Zoster vaccine.** / 1 dose for adults aged 60 years or older.  Pneumococcal 13-valent conjugate (PCV13) vaccine.** / Consult your health care provider.  Pneumococcal polysaccharide (PPSV23) vaccine.** / 1 dose for all  adults aged 65 years and older.  Meningococcal vaccine.** / Consult your health care provider.  Hepatitis A vaccine.** / Consult your health care provider.  Hepatitis B vaccine.** / Consult your health care provider.  Haemophilus influenzae type b (Hib) vaccine.** / Consult your health care provider. **Family history and personal history of risk and conditions may change your health care provider's recommendations. Document Released: 10/23/2001 Document Revised: 06/17/2013 Document Reviewed: 01/22/2011 [employer] Patient Information 2014 [employer].  

## 2014-02-10 ENCOUNTER — Encounter: Payer: Self-pay | Admitting: Gastroenterology

## 2014-02-11 ENCOUNTER — Other Ambulatory Visit: Payer: Self-pay | Admitting: Family Medicine

## 2014-03-11 ENCOUNTER — Other Ambulatory Visit: Payer: Self-pay | Admitting: Family Medicine

## 2014-03-24 ENCOUNTER — Other Ambulatory Visit: Payer: Self-pay | Admitting: Family Medicine

## 2014-04-05 ENCOUNTER — Encounter: Payer: Self-pay | Admitting: Gastroenterology

## 2014-04-05 ENCOUNTER — Ambulatory Visit (INDEPENDENT_AMBULATORY_CARE_PROVIDER_SITE_OTHER): Payer: Medicare Other | Admitting: Gastroenterology

## 2014-04-05 VITALS — BP 138/62 | HR 76 | Ht 73.5 in | Wt 258.2 lb

## 2014-04-05 DIAGNOSIS — Z1211 Encounter for screening for malignant neoplasm of colon: Secondary | ICD-10-CM

## 2014-04-05 NOTE — Patient Instructions (Signed)
Cologuard  Colon cancer screening test.  If this is positive, you need a colonoscopy. If this is negative, you don't need colon cancer screening any further.

## 2014-04-05 NOTE — Progress Notes (Signed)
HPI: This is a    very pleasant 80 year old man whom I am meeti36ng for the first time today.  Cbc 07/2013 was normal.  Sent by PCP to discuss colon cancer screening.  He has never had colon cancer screening, colonoscopy that he can recall.  No troubles with his bowels. No overt bleeding.  Meformin caused some loose stools, redosing it really helped.  Never blood in his stool.  Overall weight  Has been stable in about 10 pounds range for many years.  No colon cancer in his family.  Review of systems: Pertinent positive and negative review of systems were noted in the above HPI section. Complete review of systems was performed and was otherwise normal.    Past Medical History  Diagnosis Date  . Hypertension   . Pneumonia   . Hyperlipidemia   . Diabetes mellitus without complication   . Other chronic bronchitis   . COPD (chronic obstructive pulmonary disease)     He smoked for about 10 years but worked in the [employer] industry for 45 years.   . Asthma     Past Surgical History  Procedure Laterality Date  . Inguinal hernia repair  1992    Left    Current Outpatient Prescriptions  Medication Sig Dispense Refill  . aspirin 81 MG tablet Take 81 mg by mouth daily.      . budesonide-formoterol (SYMBICORT) 160-4.5 MCG/ACT inhaler Inhale 2 puffs into the lungs 2 (two) times daily.  1 Inhaler  11  . glipiZIDE (GLUCOTROL XL) 5 MG 24 hr tablet Take 1 tablet (5 mg total) by mouth daily with breakfast.  30 tablet  5  . losartan-hydrochlorothiazide (HYZAAR) 100-12.5 MG per tablet Take 1/2 tablet po twice a day  180 tablet  3  . metFORMIN (GLUCOPHAGE XR) 500 MG 24 hr tablet Take 2 tablets (1,000 mg total) by mouth daily.  60 tablet  5  . pioglitazone (ACTOS) 15 MG tablet Take 1 tablet (15 mg total) by mouth daily.  90 tablet  1  . pravastatin (PRAVACHOL) 40 MG tablet Take 1 tablet (40 mg total) by mouth at bedtime.  90 tablet  1  . Tdap (BOOSTRIX) 5-2.5-18.5 LF-MCG/0.5 injection Inject  0.5 mLs into the muscle once.  0.5 mL  0  . zoster vaccine live, PF, (ZOSTAVAX) [id number] UNT/0.65ML injection Inject 19,400 Units into the skin once.  1 each  0   No current facility-administered medications for this visit.    Allergies as of 04/05/2014 - Review Complete 04/05/2014  Allergen Reaction Noted  . Codeine Other (See Comments) 02/09/2014    Family History  Problem Relation Age of Onset  . Adopted: Yes  . Asthma Mother   . Rheum arthritis Mother     History   Social History  . Marital Status: Married    Spouse Name: Nicole CellaDorothy    Number of Children: 2  . Years of Education: 12   Occupational History  . RETIRED  Other    [employer]    Social History Main Topics  . Smoking status: Former Smoker -- 1.00 packs/day for 10 years    Types: Cigarettes    Quit date: 02/25/1963  . Smokeless tobacco: Never Used  . Alcohol Use: 1.0 oz/week    2 drink(s) per week  . Drug Use: No  . Sexual Activity: Yes    Partners: Female   Other Topics Concern  . Not on file   Social History Narrative   Marital Status:  Married (Dorothy)   Children: Daughter Lawson Fiscal)  and Son Zebrowski)    Pets:  None     Living Situation: Lives with  wife    Occupation: Retired Programmer, [employer])    Education: Engineer, agricultural    Tobacco Use:  He smoked 1 ppd for about 10 years.  He quit 50 years ago at the age of 4.      Alcohol Use:  Occasional (Wine)    Drug Use:  None   Diet:  Regular   Exercise:  None   Hobbies: Gardening                    Physical Exam: BP 138/62  Pulse 76  Ht 6' 1.5" (1.867 m)  Wt 258 lb 3.2 oz (117.119 kg)  BMI 33.60 kg/m2 Constitutional: generally well-appearing Psychiatric: alert and oriented x3 Eyes: extraocular movements intact Mouth: oral pharynx moist, no lesions Neck: supple no lymphadenopathy Cardiovascular: heart regular rate and rhythm Lungs: clear to auscultation bilaterally Abdomen: soft, nontender, nondistended, no obvious  ascites, no peritoneal signs, normal bowel sounds Extremities: no lower extremity edema bilaterally Skin: no lesions on visible extremities    Assessment and plan: 80 y.o. male with  routine risk for colon cancer  He has never had colon cancer screening that I can tell of. He is definitely not had a colonoscopy previously. We discussed the fact that colon cancer screening generally stops between age of 87 and 46. I recommended at the age of A3 that he proceed with Colo guard colon cancer screening stool test since he has never had screening before. If this is positive and we will proceed with colonoscopy if that is negative and he did not require further colon cancer screening.

## 2014-04-16 ENCOUNTER — Other Ambulatory Visit: Payer: Self-pay | Admitting: Family Medicine

## 2014-04-16 DIAGNOSIS — E785 Hyperlipidemia, unspecified: Secondary | ICD-10-CM

## 2014-04-16 DIAGNOSIS — I10 Essential (primary) hypertension: Secondary | ICD-10-CM

## 2014-04-16 DIAGNOSIS — E119 Type 2 diabetes mellitus without complications: Secondary | ICD-10-CM

## 2014-04-16 MED ORDER — PRAVASTATIN SODIUM 40 MG PO TABS: 40.0000 mg | ORAL_TABLET | Freq: Every day | ORAL | Status: AC

## 2014-04-16 MED ORDER — PIOGLITAZONE HCL 15 MG PO TABS: 15.0000 mg | ORAL_TABLET | Freq: Every day | ORAL | Status: AC

## 2014-04-16 MED ORDER — LOSARTAN POTASSIUM-HCTZ 100-12.5 MG PO TABS: ORAL_TABLET | ORAL | Status: AC

## 2014-04-16 MED ORDER — METFORMIN HCL ER 500 MG PO TB24: 1000.0000 mg | ORAL_TABLET | Freq: Every day | ORAL | Status: AC

## 2014-04-22 ENCOUNTER — Telehealth: Payer: Self-pay | Admitting: Family Medicine

## 2014-04-22 NOTE — Telephone Encounter (Signed)
[facility] stating that he needs to talk to someone regarding metformin dosage. [phone]

## 2015-07-21 IMAGING — CR DG CHEST 2V
2 series · 2 of 2 positions shown · non-contrast
Comparison: 04/07/2012

CLINICAL DATA: Cough.  COPD

EXAM:
CHEST  2 VIEW

[w chest pa]
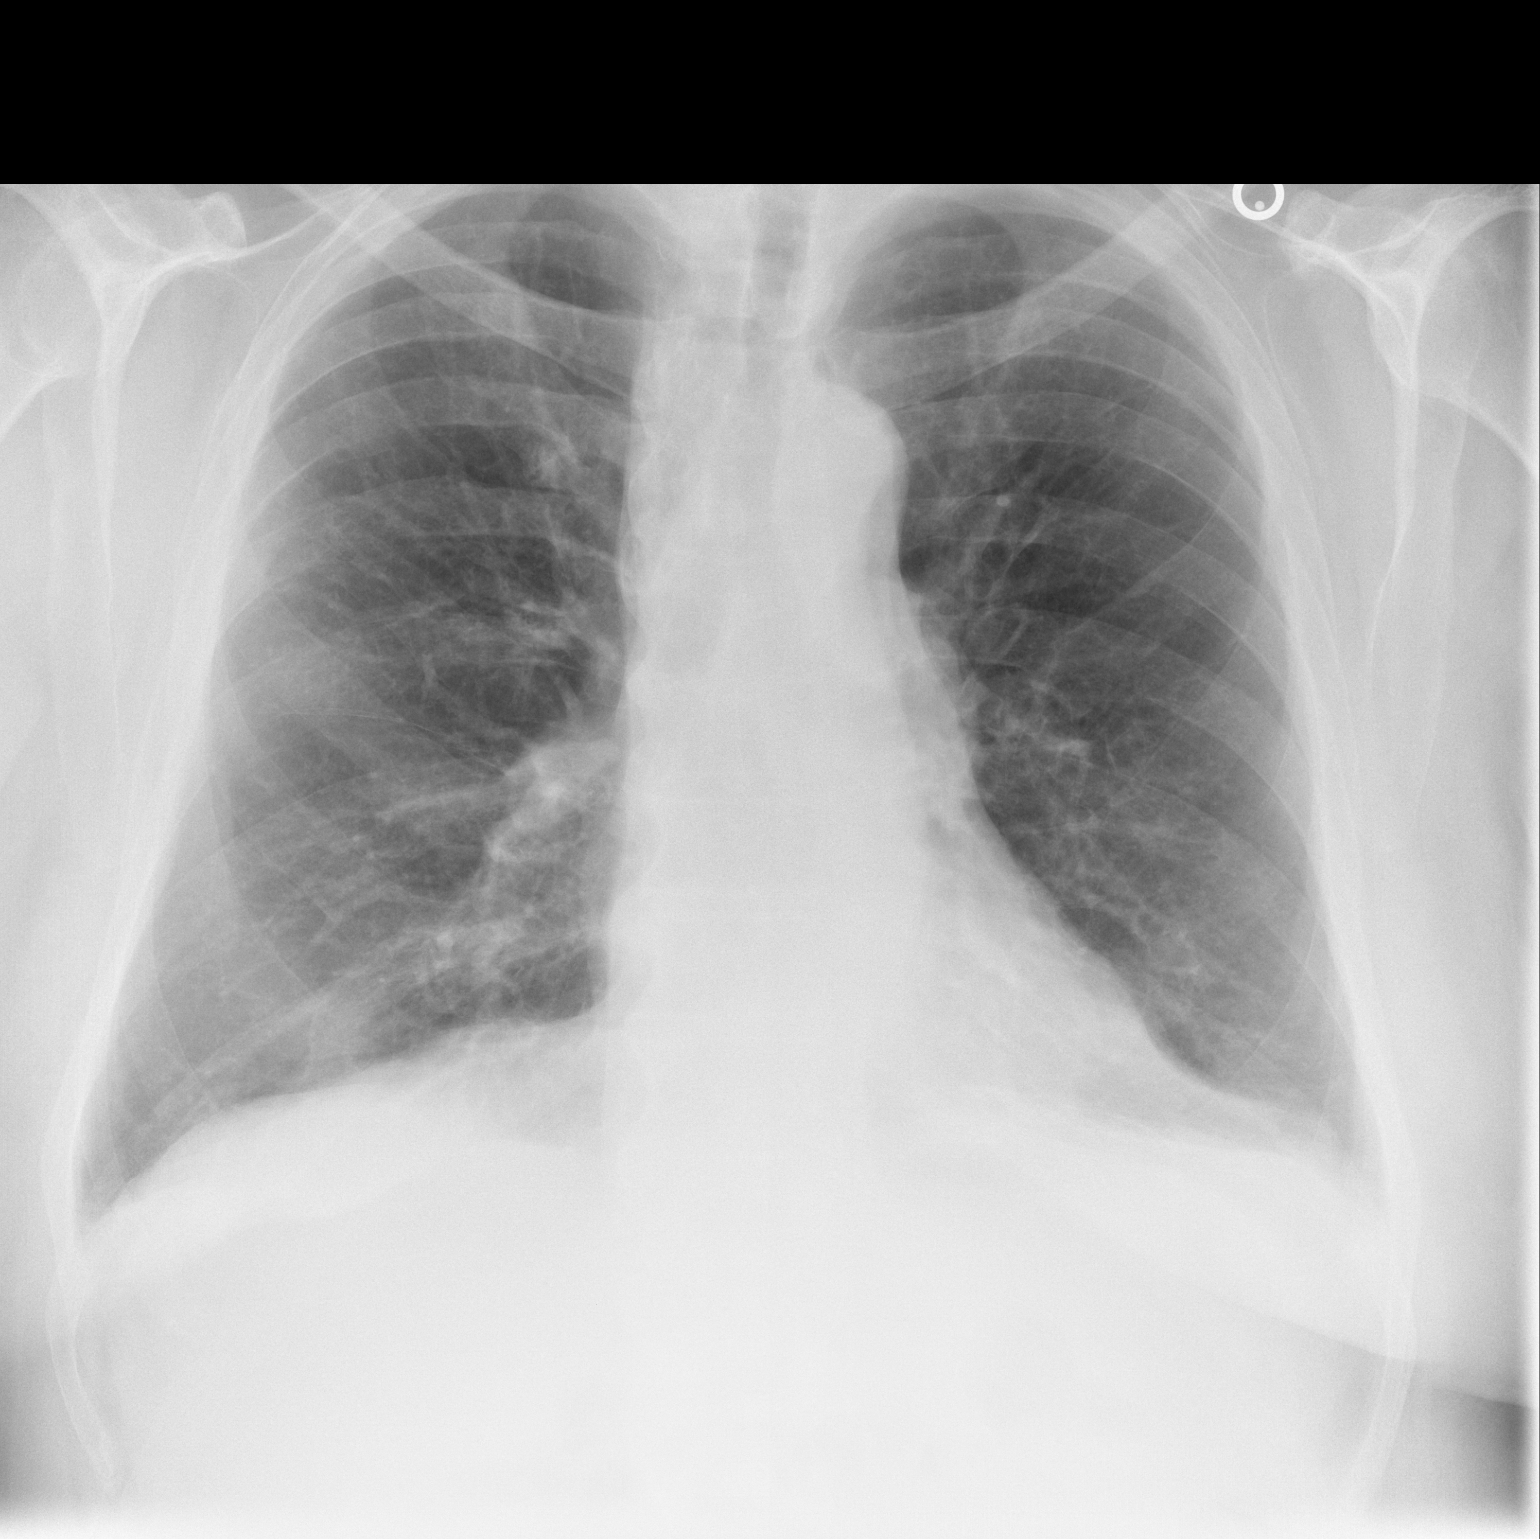

[w chest lat]
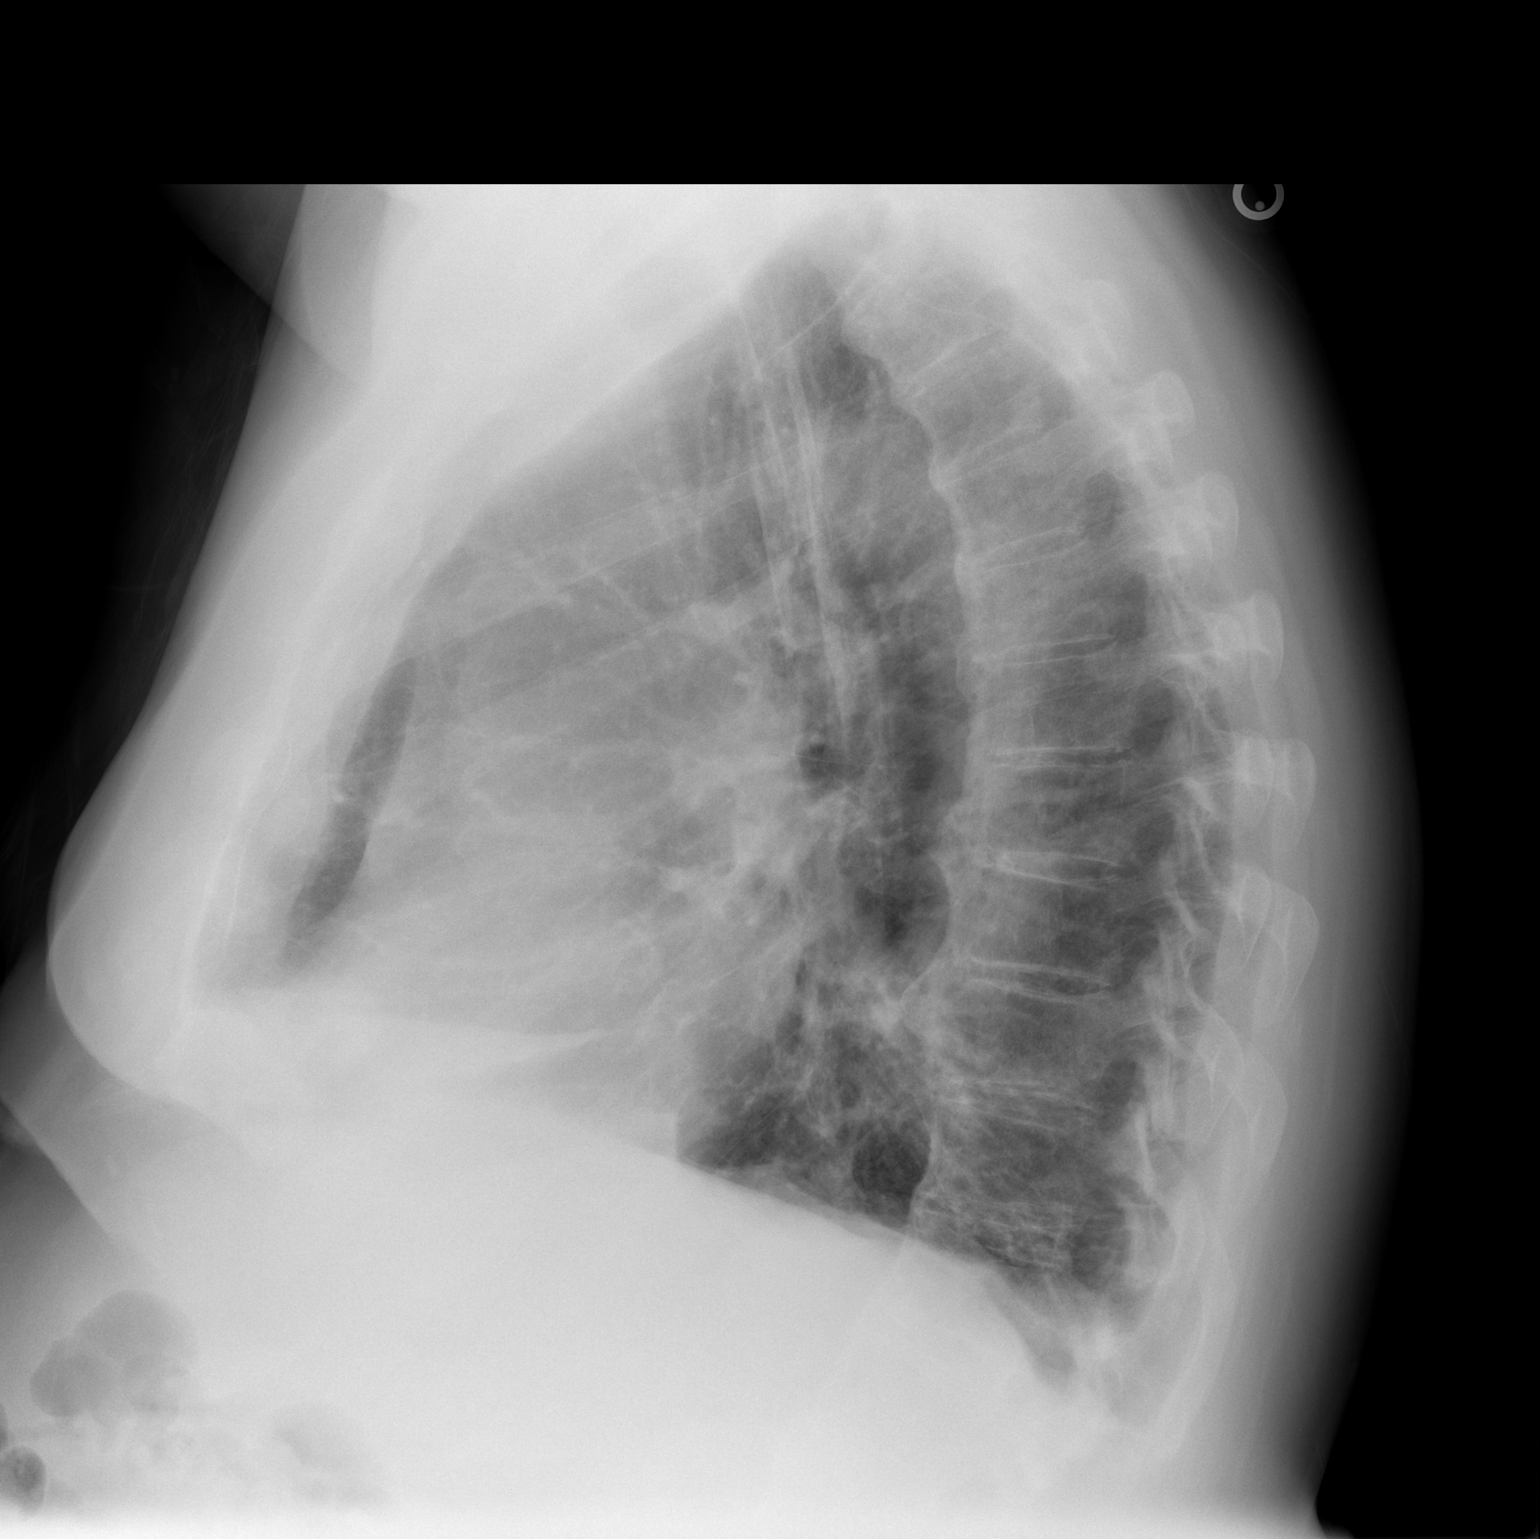

[2 of 2 positions shown; findings below may reference images not displayed]

FINDINGS: The heart size and mediastinal contours are within normal limits.
Both lungs are clear. Changes of COPD again noted. Mild left basilar
scarring is stable. The visualized skeletal structures are
unremarkable.
IMPRESSION: COPD.  No active disease.
